# Patient Record
Sex: Male | Born: 1965 | Race: Black or African American | Hispanic: No | Marital: Single | Smoking: Never smoker
Health system: Southern US, Community
[De-identification: ages and names within clinical notes are randomized; demographics above are authoritative.]

## PROBLEM LIST (undated history)

## (undated) DIAGNOSIS — B20 Human immunodeficiency virus [HIV] disease: Secondary | ICD-10-CM

## (undated) DIAGNOSIS — E7800 Pure hypercholesterolemia, unspecified: Secondary | ICD-10-CM

## (undated) HISTORY — PX: KNEE SURGERY: SHX244

---

## 2015-04-24 ENCOUNTER — Emergency Department (HOSPITAL_COMMUNITY)
Admission: EM | Admit: 2015-04-24 | Discharge: 2015-04-24 | Disposition: A | Discharge status: Home / Self Care | Payer: BLUE CROSS/BLUE SHIELD | Attending: Emergency Medicine | Admitting: Emergency Medicine

## 2015-04-24 ENCOUNTER — Encounter (HOSPITAL_COMMUNITY): Payer: Self-pay | Admitting: Emergency Medicine

## 2015-04-24 DIAGNOSIS — R197 Diarrhea, unspecified: Secondary | ICD-10-CM | POA: Insufficient documentation

## 2015-04-24 DIAGNOSIS — Z21 Asymptomatic human immunodeficiency virus [HIV] infection status: Secondary | ICD-10-CM | POA: Diagnosis not present

## 2015-04-24 DIAGNOSIS — R1084 Generalized abdominal pain: Secondary | ICD-10-CM | POA: Diagnosis present

## 2015-04-24 DIAGNOSIS — Z8639 Personal history of other endocrine, nutritional and metabolic disease: Secondary | ICD-10-CM | POA: Diagnosis not present

## 2015-04-24 HISTORY — DX: Human immunodeficiency virus (HIV) disease: B20

## 2015-04-24 HISTORY — DX: Pure hypercholesterolemia, unspecified: E78.00

## 2015-04-24 NOTE — ED Notes (Signed)
C/o abdominal cramping (somewhat supra pubic)  X 4 days with mucus in his stool, diarrhea.  VSS

## 2015-04-24 NOTE — Discharge Instructions (Signed)

## 2015-04-24 NOTE — ED Provider Notes (Signed)
CSN: [id number]     Arrival date & time 04/24/15  0847 History   First MD Initiated Contact with Patient 04/24/15 [phone]     Chief Complaint  Patient presents with  . Abdominal Pain     (Consider location/radiation/quality/duration/timing/severity/associated sxs/prior Treatment) HPI Comments: Had diarrhea with some mucus in his stools. No fever or rectal bleeding.  Patient is a 49 y.o. male presenting with abdominal pain. The history is provided by the patient.  Abdominal Pain Pain location:  Generalized Pain quality: cramping   Pain radiates to:  Does not radiate Pain severity:  Mild Onset quality:  Gradual Duration:  5 days Timing:  Constant Progression:  Improving Chronicity:  New Context: not recent illness (no recent antibiotic use), not recent travel, not sick contacts and not suspicious food intake   Relieved by:  Nothing Worsened by:  Nothing tried Associated symptoms: diarrhea   Associated symptoms: no cough, no fever, no shortness of breath and no vomiting     Past Medical History  Diagnosis Date  . HIV (human immunodeficiency virus infection)   . Hypercholesteremia    Past Surgical History  Procedure Laterality Date  . Knee surgery Left    No family history on file. Social History  Substance Use Topics  . Smoking status: Never Smoker   . Smokeless tobacco: Never Used  . Alcohol Use: No    Review of Systems  Constitutional: Negative for fever.  Respiratory: Negative for cough and shortness of breath.   Gastrointestinal: Positive for abdominal pain (cramping) and diarrhea. Negative for vomiting.  All other systems reviewed and are negative.     Allergies  Review of patient's allergies indicates no known allergies.  Home Medications   Prior to Admission medications   Not on File   BP 138/87 mmHg  Pulse 71  Temp(Src) 98.2 F (36.8 C) (Oral)  Resp 18  SpO2 100% Physical Exam  Constitutional: He is oriented to person, place, and time. He  appears well-developed and well-nourished. No distress.  HENT:  Head: Normocephalic and atraumatic.  Mouth/Throat: No oropharyngeal exudate.  Eyes: EOM are normal. Pupils are equal, round, and reactive to light.  Neck: Normal range of motion. Neck supple.  Cardiovascular: Normal rate and regular rhythm.  Exam reveals no friction rub.   No murmur heard. Pulmonary/Chest: Effort normal and breath sounds normal. No respiratory distress. He has no wheezes. He has no rales.  Abdominal: He exhibits no distension. There is no tenderness. There is no rebound.  Musculoskeletal: Normal range of motion. He exhibits no edema.  Neurological: He is alert and oriented to person, place, and time.  Skin: He is not diaphoretic.  Nursing note and vitals reviewed.   ED Course  Procedures (including critical care time) Labs Review Labs Reviewed - No data to display  Imaging Review No results found. I have personally reviewed and evaluated these images and lab results as part of my medical decision-making.   EKG Interpretation None      MDM   Final diagnoses:  Diarrhea    49 year old male here with diarrhea. He came in because he was concerned about some mucus in his stools. Denies any fever, blood in his stools, abdominal pain. Had some abdominal cramping a few days ago but that has improved. No vomiting, nausea, chest pain, shortness of breath. He is compliant with his HIV medications but his arm or his previous CD4 count or viral load.. Vitals are stable and exam is benign. I explained we could  do labs and hydrate but he rather wait for his outpatient doctor to do this. He is has no pain at this time. No international travel or recent antibiotics. Feel he is stable for discharge.    Elwin Mocha, MD 04/24/15 [phone]

## 2015-04-24 NOTE — ED Notes (Signed)
Vital signs stable. No acute distress noted.  Will followup with provider on 17th of this month.  Understands to come back for signs of worsening in condition.

## 2015-04-24 NOTE — ED Notes (Signed)
Dr. Walden at bedside 

## 2015-10-29 ENCOUNTER — Emergency Department (HOSPITAL_COMMUNITY): Payer: BLUE CROSS/BLUE SHIELD

## 2015-10-29 ENCOUNTER — Emergency Department (HOSPITAL_COMMUNITY)
Admission: EM | Admit: 2015-10-29 | Discharge: 2015-10-29 | Disposition: A | Discharge status: Home / Self Care | Payer: BLUE CROSS/BLUE SHIELD | Attending: Emergency Medicine | Admitting: Emergency Medicine

## 2015-10-29 DIAGNOSIS — E7800 Pure hypercholesterolemia, unspecified: Secondary | ICD-10-CM | POA: Insufficient documentation

## 2015-10-29 DIAGNOSIS — R079 Chest pain, unspecified: Secondary | ICD-10-CM | POA: Insufficient documentation

## 2015-10-29 DIAGNOSIS — Z79899 Other long term (current) drug therapy: Secondary | ICD-10-CM | POA: Insufficient documentation

## 2015-10-29 DIAGNOSIS — B20 Human immunodeficiency virus [HIV] disease: Secondary | ICD-10-CM | POA: Insufficient documentation

## 2015-10-29 DIAGNOSIS — R42 Dizziness and giddiness: Secondary | ICD-10-CM | POA: Insufficient documentation

## 2015-10-29 LAB — BASIC METABOLIC PANEL
ANION GAP: 11 (ref 5–15)
BUN: 14 mg/dL (ref 6–20)
CALCIUM: 9.8 mg/dL (ref 8.9–10.3)
CHLORIDE: 105 mmol/L (ref 101–111)
CO2: 25 mmol/L (ref 22–32)
Creatinine, Ser: 1.2 mg/dL (ref 0.61–1.24)
GFR calc non Af Amer: >60 mL/min (ref >60)
GLUCOSE: 97 mg/dL (ref 65–99)
Potassium: 3.9 mmol/L (ref 3.5–5.1)
Sodium: 141 mmol/L (ref 135–145)

## 2015-10-29 LAB — I-STAT TROPONIN, ED
TROPONIN I, POC: 0 ng/mL (ref 0.00–0.08)
Troponin i, poc: 0 ng/mL (ref 0.00–0.08)

## 2015-10-29 LAB — CBC
HCT: 42.1 % (ref 39.0–52.0)
Hemoglobin: 13.5 g/dL (ref 13.0–17.0)
MCH: 28.1 pg (ref 26.0–34.0)
MCHC: 32.1 g/dL (ref 30.0–36.0)
MCV: 87.7 fL (ref 78.0–100.0)
PLATELETS: 194 10*3/uL (ref 150–400)
RBC: 4.8 MIL/uL (ref 4.22–5.81)
RDW: 14.2 % (ref 11.5–15.5)
WBC: 3.5 10*3/uL — ABNORMAL LOW (ref 4.0–10.5)

## 2015-10-29 LAB — D-DIMER, QUANTITATIVE: D-Dimer, Quant: <0.27 ug/mL-FEU (ref 0.00–0.50)

## 2015-10-29 NOTE — ED Notes (Addendum)
Pt sts that he started to have CP 1 week ago. Pt also has SOB and nausea when pain comes on. Pain is intermittent and is better when resting. No acute distress at this time.

## 2015-10-29 NOTE — ED Provider Notes (Signed)
CSN: [id number]     Arrival date & time 10/29/15  1041 History   First MD Initiated Contact with Patient 10/29/15 1649     Chief Complaint  Patient presents with  . Chest Pain     (Consider location/radiation/quality/duration/timing/severity/associated sxs/prior Treatment) HPI Comments: Patient with a history of HIV and hyperlipidemia presents with chest pain. He states he's had a one-week history of intermittent chest pain. He states he has it frequently during the day and when it comes and lasts about 5 minutes at a time. He describes as a sharp pain to the left side of his chest. He has some radiation down his left arm. There is no shortness of breath nausea or diaphoresis associated with it. The triage note states that he has shortness of breath and nausea however patient denies this to me. He does say that he gets lightheaded when the pain comes. He denies any palpitations. He denies any exertional symptoms. There is no pleuritic pain. It's not worse after eating. He denies any leg pain or swelling. There is no cough or chest congestion. He denies any past history of heart problems. He has a history of hyperlipidemia but no other risk factors for heart disease. There is no family history of heart disease. He's a nonsmoker. He does travel back and forth from [place] frequently. His PCP is in [place]. He sees her every 6 months. He denies any leg pain or swelling. He states the pain seems to be worse when he is more stressed at work. He states it's better at night when he is calm down and is resting.  Patient is a 50 y.o. male presenting with chest pain.  Chest Pain Associated symptoms: no abdominal pain, no back pain, no cough, no diaphoresis, no dizziness, no fatigue, no fever, no headache, no nausea, no numbness, no shortness of breath, not vomiting and no weakness     Past Medical History  Diagnosis Date  . HIV (human immunodeficiency virus infection)   . Hypercholesteremia    Past  Surgical History  Procedure Laterality Date  . Knee surgery Left    No family history on file. Social History  Substance Use Topics  . Smoking status: Never Smoker   . Smokeless tobacco: Never Used  . Alcohol Use: No    Review of Systems  Constitutional: Negative for fever, chills, diaphoresis and fatigue.  HENT: Negative for congestion, rhinorrhea and sneezing.   Eyes: Negative.   Respiratory: Negative for cough, chest tightness and shortness of breath.   Cardiovascular: Positive for chest pain. Negative for leg swelling.  Gastrointestinal: Negative for nausea, vomiting, abdominal pain, diarrhea and blood in stool.  Genitourinary: Negative for frequency, hematuria, flank pain and difficulty urinating.  Musculoskeletal: Negative for back pain and arthralgias.  Skin: Negative for rash.  Neurological: Positive for light-headedness. Negative for dizziness, speech difficulty, weakness, numbness and headaches.      Allergies  Review of patient's allergies indicates no known allergies.  Home Medications   Prior to Admission medications   Medication Sig Start Date End Date Taking? Authorizing Provider  efavirenz-emtricitabine-tenofovir (ATRIPLA) 600-200-300 MG tablet Take 1 tablet by mouth at bedtime.   Yes Historical Provider, MD  simvastatin (ZOCOR) 40 MG tablet Take 40 mg by mouth daily.   Yes Historical Provider, MD   BP 109/72 mmHg  Pulse 73  Temp(Src) 98.3 F (36.8 C) (Oral)  Resp 12  Ht  (1.956 m)  Wt 190 lb (86.183 kg)  BMI 22.53 kg/m2  SpO2 100% Physical Exam  Constitutional: He is oriented to person, place, and time. He appears well-developed and well-nourished.  HENT:  Head: Normocephalic and atraumatic.  Eyes: Pupils are equal, round, and reactive to light.  Neck: Normal range of motion. Neck supple.  Cardiovascular: Normal rate, regular rhythm and normal heart sounds.   Pulmonary/Chest: Effort normal and breath sounds normal. No respiratory distress. He  has no wheezes. He has no rales. He exhibits no tenderness.  Abdominal: Soft. Bowel sounds are normal. There is no tenderness. There is no rebound and no guarding.  Musculoskeletal: Normal range of motion. He exhibits no edema.  Lymphadenopathy:    He has no cervical adenopathy.  Neurological: He is alert and oriented to person, place, and time.  Skin: Skin is warm and dry. No rash noted.  Psychiatric: He has a normal mood and affect.    ED Course  Procedures (including critical care time) Labs Review Results for orders placed or performed during the hospital encounter of 10/29/15  Basic metabolic panel  Result Value Ref Range   Sodium 141 135 - 145 mmol/L   Potassium 3.9 3.5 - 5.1 mmol/L   Chloride 105 101 - 111 mmol/L   CO2 25 22 - 32 mmol/L   Glucose, Bld 97 65 - 99 mg/dL   BUN 14 6 - 20 mg/dL   Creatinine, Ser 1.611.20 0.61 - 1.24 mg/dL   Calcium 9.8 8.9 - 09.610.3 mg/dL   GFR calc non Af Amer >60 >60 mL/min   GFR calc Af Amer >60 >60 mL/min   Anion gap 11 5 - 15  CBC  Result Value Ref Range   WBC 3.5 (L) 4.0 - 10.5 K/uL   RBC 4.80 4.22 - 5.81 MIL/uL   Hemoglobin 13.5 13.0 - 17.0 g/dL   HCT 04.542.1 40.939.0 - 81.152.0 %   MCV 87.7 78.0 - 100.0 fL   MCH 28.1 26.0 - 34.0 pg   MCHC 32.1 30.0 - 36.0 g/dL   RDW 91.414.2 78.211.5 - 95.615.5 %   Platelets 194 150 - 400 K/uL  D-dimer, quantitative  Result Value Ref Range   D-Dimer, Quant <0.27 0.00 - 0.50 ug/mL-FEU  I-Stat Troponin, ED (not at [facility])  Result Value Ref Range   Troponin i, poc 0.00 0.00 - 0.08 ng/mL   Comment 3          I-stat troponin, ED  Result Value Ref Range   Troponin i, poc 0.00 0.00 - 0.08 ng/mL   Comment 3           Dg Chest 2 View  10/29/2015  CLINICAL DATA:  Left-sided chest pain for 1 week EXAM: CHEST  2 VIEW COMPARISON:  None. FINDINGS: The heart size and mediastinal contours are within normal limits. Both lungs are clear. The visualized skeletal structures are unremarkable. IMPRESSION: No active cardiopulmonary disease.  Electronically Signed   By: Alcide CleverMark  Lukens M.D.   On: 10/29/2015 11:34      Imaging Review Dg Chest 2 View  10/29/2015  CLINICAL DATA:  Left-sided chest pain for 1 week EXAM: CHEST  2 VIEW COMPARISON:  None. FINDINGS: The heart size and mediastinal contours are within normal limits. Both lungs are clear. The visualized skeletal structures are unremarkable. IMPRESSION: No active cardiopulmonary disease. Electronically Signed   By: Alcide CleverMark  Lukens M.D.   On: 10/29/2015 11:34   I have personally reviewed and evaluated these images and lab results as part of my medical decision-making.   EKG Interpretation  Date/Time:  Tuesday October 29 2015 10:48:59 EDT Ventricular Rate:  97 PR Interval:  142 QRS Duration: 86 QT Interval:  346 QTC Calculation: 439 R Axis:   81 Text Interpretation:  Normal sinus rhythm Normal ECG No old tracing to  compare Confirmed by Jinnifer Montejano  MD, Elvenia Godden ([id number]) on 10/29/2015 5:09:32 PM      MDM   Final diagnoses:  Chest pain, unspecified chest pain type    Patient presents with chest pain. He is currently pain-free. He has no exertional symptoms. No other suggestions of pulmonary embolus. His d-dimer is negative. His EKG does not show any ischemic changes. He's had delta troponin which have been negative. He has a heart score of 2. I feel he can be further evaluated as an outpatient. He has an appointment with his PCP in [place] on Friday which is 3 days from now. He was given strict return precautions.    Rolan Bucco, MD 10/29/15 [phone]

## 2015-10-29 NOTE — Discharge Instructions (Signed)
Nonspecific Chest Pain  °Chest pain can be caused by many different conditions. There is always a chance that your pain could be related to something serious, such as a heart attack or a blood clot in your lungs. Chest pain can also be caused by conditions that are not life-threatening. If you have chest pain, it is very important to follow up with your health care provider. °CAUSES  °Chest pain can be caused by: °· Heartburn. °· Pneumonia or bronchitis. °· Anxiety or stress. °· Inflammation around your heart (pericarditis) or lung (pleuritis or pleurisy). °· A blood clot in your lung. °· A collapsed lung (pneumothorax). It can develop suddenly on its own (spontaneous pneumothorax) or from trauma to the chest. °· Shingles infection (varicella-zoster virus). °· Heart attack. °· Damage to the bones, muscles, and cartilage that make up your chest wall. This can include: °¨ Bruised bones due to injury. °¨ Strained muscles or cartilage due to frequent or repeated coughing or overwork. °¨ Fracture to one or more ribs. °¨ Sore cartilage due to inflammation (costochondritis). °RISK FACTORS  °Risk factors for chest pain may include: °· Activities that increase your risk for trauma or injury to your chest. °· Respiratory infections or conditions that cause frequent coughing. °· Medical conditions or overeating that can cause heartburn. °· Heart disease or family history of heart disease. °· Conditions or health behaviors that increase your risk of developing a blood clot. °· Having had chicken pox (varicella zoster). °SIGNS AND SYMPTOMS °Chest pain can feel like: °· Burning or tingling on the surface of your chest or deep in your chest. °· Crushing, pressure, aching, or squeezing pain. °· Dull or sharp pain that is worse when you move, cough, or take a deep breath. °· Pain that is also felt in your back, neck, shoulder, or arm, or pain that spreads to any of these areas. °Your chest pain may come and go, or it may stay  constant. °DIAGNOSIS °Lab tests or other studies may be needed to find the cause of your pain. Your health care provider may have you take a test called an ambulatory ECG (electrocardiogram). An ECG records your heartbeat patterns at the time the test is performed. You may also have other tests, such as: °· Transthoracic echocardiogram (TTE). During echocardiography, sound waves are used to create a picture of all of the heart structures and to look at how blood flows through your heart. °· Transesophageal echocardiogram (TEE). This is a more advanced imaging test that obtains images from inside your body. It allows your health care provider to see your heart in finer detail. °· Cardiac monitoring. This allows your health care provider to monitor your heart rate and rhythm in real time. °· Holter monitor. This is a portable device that records your heartbeat and can help to diagnose abnormal heartbeats. It allows your health care provider to track your heart activity for several days, if needed. °· Stress tests. These can be done through exercise or by taking medicine that makes your heart beat more quickly. °· Blood tests. °· Imaging tests. °TREATMENT  °Your treatment depends on what is causing your chest pain. Treatment may include: °· Medicines. These may include: °¨ Acid blockers for heartburn. °¨ Anti-inflammatory medicine. °¨ Pain medicine for inflammatory conditions. °¨ Antibiotic medicine, if an infection is present. °¨ Medicines to dissolve blood clots. °¨ Medicines to treat coronary artery disease. °· Supportive care for conditions that do not require medicines. This may include: °¨ Resting. °¨ Applying heat   or cold packs to injured areas. °¨ Limiting activities until pain decreases. °HOME CARE INSTRUCTIONS °· If you were prescribed an antibiotic medicine, finish it all even if you start to feel better. °· Avoid any activities that bring on chest pain. °· Do not use any tobacco products, including  cigarettes, chewing tobacco, or electronic cigarettes. If you need help quitting, ask your health care provider. °· Do not drink alcohol. °· Take medicines only as directed by your health care provider. °· Keep all follow-up visits as directed by your health care provider. This is important. This includes any further testing if your chest pain does not go away. °· If heartburn is the cause for your chest pain, you may be told to keep your head raised (elevated) while sleeping. This reduces the chance that acid will go from your stomach into your esophagus. °· Make lifestyle changes as directed by your health care provider. These may include: °¨ Getting regular exercise. Ask your health care provider to suggest some activities that are safe for you. °¨ Eating a heart-healthy diet. A registered dietitian can help you to learn healthy eating options. °¨ Maintaining a healthy weight. °¨ Managing diabetes, if necessary. °¨ Reducing stress. °SEEK MEDICAL CARE IF: °· Your chest pain does not go away after treatment. °· You have a rash with blisters on your chest. °· You have a fever. °SEEK IMMEDIATE MEDICAL CARE IF:  °· Your chest pain is worse. °· You have an increasing cough, or you cough up blood. °· You have severe abdominal pain. °· You have severe weakness. °· You faint. °· You have chills. °· You have sudden, unexplained chest discomfort. °· You have sudden, unexplained discomfort in your arms, back, neck, or jaw. °· You have shortness of breath at any time. °· You suddenly start to sweat, or your skin gets clammy. °· You feel nauseous or you vomit. °· You suddenly feel light-headed or dizzy. °· Your heart begins to beat quickly, or it feels like it is skipping beats. °These symptoms may represent a serious problem that is an emergency. Do not wait to see if the symptoms will go away. Get medical help right away. Call your local emergency services (911 in the [place] Do not drive yourself to the hospital. °  °This  information is not intended to replace advice given to you by your health care provider. Make sure you discuss any questions you have with your health care provider. °  °Document Released: 05/13/2005 Document Revised: 08/24/2014 Document Reviewed: 03/09/2014 °Elsevier Interactive Patient Education ©2016 [employer] ° °

## 2015-11-07 ENCOUNTER — Emergency Department (HOSPITAL_COMMUNITY): Payer: BLUE CROSS/BLUE SHIELD

## 2015-11-07 ENCOUNTER — Emergency Department (HOSPITAL_COMMUNITY)
Admission: EM | Admit: 2015-11-07 | Discharge: 2015-11-07 | Disposition: A | Discharge status: Home / Self Care | Payer: BLUE CROSS/BLUE SHIELD | Attending: Emergency Medicine | Admitting: Emergency Medicine

## 2015-11-07 ENCOUNTER — Encounter (HOSPITAL_COMMUNITY): Payer: Self-pay | Admitting: Emergency Medicine

## 2015-11-07 DIAGNOSIS — S3991XA Unspecified injury of abdomen, initial encounter: Secondary | ICD-10-CM | POA: Insufficient documentation

## 2015-11-07 DIAGNOSIS — R55 Syncope and collapse: Secondary | ICD-10-CM

## 2015-11-07 DIAGNOSIS — Y9389 Activity, other specified: Secondary | ICD-10-CM | POA: Diagnosis not present

## 2015-11-07 DIAGNOSIS — Y9289 Other specified places as the place of occurrence of the external cause: Secondary | ICD-10-CM | POA: Diagnosis not present

## 2015-11-07 DIAGNOSIS — E7800 Pure hypercholesterolemia, unspecified: Secondary | ICD-10-CM | POA: Insufficient documentation

## 2015-11-07 DIAGNOSIS — Z79899 Other long term (current) drug therapy: Secondary | ICD-10-CM | POA: Diagnosis not present

## 2015-11-07 DIAGNOSIS — W01198A Fall on same level from slipping, tripping and stumbling with subsequent striking against other object, initial encounter: Secondary | ICD-10-CM | POA: Insufficient documentation

## 2015-11-07 DIAGNOSIS — Y998 Other external cause status: Secondary | ICD-10-CM | POA: Insufficient documentation

## 2015-11-07 DIAGNOSIS — S29001A Unspecified injury of muscle and tendon of front wall of thorax, initial encounter: Secondary | ICD-10-CM | POA: Insufficient documentation

## 2015-11-07 DIAGNOSIS — B20 Human immunodeficiency virus [HIV] disease: Secondary | ICD-10-CM | POA: Insufficient documentation

## 2015-11-07 LAB — BASIC METABOLIC PANEL
Anion gap: 8 (ref 5–15)
BUN: 11 mg/dL (ref 6–20)
CO2: 25 mmol/L (ref 22–32)
Calcium: 9.3 mg/dL (ref 8.9–10.3)
Chloride: 109 mmol/L (ref 101–111)
Creatinine, Ser: 1.24 mg/dL (ref 0.61–1.24)
GFR calc non Af Amer: >60 mL/min (ref >60)
Glucose, Bld: 93 mg/dL (ref 65–99)
Potassium: 4.2 mmol/L (ref 3.5–5.1)
Sodium: 142 mmol/L (ref 135–145)

## 2015-11-07 LAB — CBC
HEMATOCRIT: 38.9 % — AB (ref 39.0–52.0)
HEMOGLOBIN: 13.2 g/dL (ref 13.0–17.0)
MCH: 29.3 pg (ref 26.0–34.0)
MCHC: 33.9 g/dL (ref 30.0–36.0)
MCV: 86.4 fL (ref 78.0–100.0)
Platelets: 214 10*3/uL (ref 150–400)
RBC: 4.5 MIL/uL (ref 4.22–5.81)
RDW: 14.5 % (ref 11.5–15.5)
WBC: 5.6 10*3/uL (ref 4.0–10.5)

## 2015-11-07 LAB — I-STAT TROPONIN, ED: Troponin i, poc: 0 ng/mL (ref 0.00–0.08)

## 2015-11-07 MED ORDER — IBUPROFEN 400 MG PO TABS: 400.0000 mg | ORAL_TABLET | Freq: Four times a day (QID) | ORAL | Status: AC | PRN

## 2015-11-07 MED ORDER — METHOCARBAMOL 500 MG PO TABS: 500.0000 mg | ORAL_TABLET | Freq: Two times a day (BID) | ORAL | Status: AC

## 2015-11-07 MED ORDER — KETOROLAC TROMETHAMINE 60 MG/2ML IM SOLN
60.0000 mg | Freq: Once | INTRAMUSCULAR | Status: AC
  Administered 2015-11-07: 60 mg via INTRAMUSCULAR
  Filled 2015-11-07: qty 2

## 2015-11-07 MED ORDER — METHOCARBAMOL 500 MG PO TABS
1000.0000 mg | ORAL_TABLET | Freq: Once | ORAL | Status: AC
  Administered 2015-11-07: 1000 mg via ORAL
  Filled 2015-11-07: qty 2

## 2015-11-07 NOTE — ED Provider Notes (Signed)
The patient is a 50 year old male, he presents after having a dizzy spell where he lost his balance and fell striking the left lower back and side against a metal trash can as he fell to the ground. He denies having any trouble with his legs, no numbness or weakness, he did not hit his head and he denies having a syncopal episode. He now states that he remembers the entire course of events. There is been no chest pain, palpitations but states he has had some general lightheadedness. On my exam the patient has clear heart and lung sounds, normal finger-nose-finger, normal coordination, normal speech, cranial nerves III through XII are normal, abdomen soft and nontender, he does have mild tenderness over the left lower lateral and posterior ribs. There is no crepitance, no subcutaneous emphysema and no bruising. Imaging negative for fracture, the patient appears stable for discharge   EKG Interpretation  Date/Time:  Thursday November 07 2015 14:01:38 EDT Ventricular Rate:  84 PR Interval:  160 QRS Duration: 91 QT Interval:  381 QTC Calculation: 450 R Axis:   66 Text Interpretation:  Sinus rhythm Left atrial enlargement ST elev, probable normal early repol pattern Baseline wander in lead(s) II III aVR aVF V5 since last tracing no significant change - T wave inversion moer prominent in V3 Confirmed by Terrianna Holsclaw  MD, Kevonte Vanecek ([id number]) on 11/07/2015 2:13:52 PM       Medical screening examination/treatment/procedure(s) were conducted as a shared visit with non-physician practitioner(s) and myself.  I personally evaluated the patient during the encounter.  Clinical Impression:   Final diagnoses:  Near syncope         Eber HongBrian Ravon Mcilhenny, MD 11/09/15 [phone]

## 2015-11-07 NOTE — ED Provider Notes (Signed)
CSN: [id number]     Arrival date & time 11/07/15  1354 History   First MD Initiated Contact with Patient 11/07/15 1407     Chief Complaint  Patient presents with  . Near Syncope  . Fall   HPI  Thomas Lee is a 50 y.o. male PMH significant for HIV, hypercholesteremia presenting s/p a near syncopal episode at work today. He states he had been standing for "a while" when he fell onto his left flank onto a trash can. He denies syncope but states he can't remember the fall. He states this has happened previously and he was evaluated for similar on 3/14, but his presenting symptom then was chest pain, and he denies CP currently. He denies fevers, chills, SOB, abdominal pain, N/V, loss of bladder/bowel control.    Past Medical History  Diagnosis Date  . HIV (human immunodeficiency virus infection) (HCC)   . Hypercholesteremia    Past Surgical History  Procedure Laterality Date  . Knee surgery Left    History reviewed. No pertinent family history. Social History  Substance Use Topics  . Smoking status: Never Smoker   . Smokeless tobacco: Never Used  . Alcohol Use: No    Review of Systems  Ten systems are reviewed and are negative for acute change except as noted in the HPI  Allergies  Review of patient's allergies indicates no known allergies.  Home Medications   Prior to Admission medications   Medication Sig Start Date End Date Taking? Authorizing Provider  efavirenz-emtricitabine-tenofovir (ATRIPLA) 600-200-300 MG tablet Take 1 tablet by mouth at bedtime.    Historical Provider, MD  simvastatin (ZOCOR) 40 MG tablet Take 40 mg by mouth daily.    Historical Provider, MD   BP 129/96 mmHg  Pulse 83  Temp(Src) 98 F (36.7 C) (Oral)  Resp 17  Ht  (1.956 m)  Wt 88.451 kg  BMI 23.12 kg/m2  SpO2 99% Physical Exam  Constitutional: He is oriented to person, place, and time. He appears well-developed and well-nourished. No distress.  HENT:  Head: Normocephalic and  atraumatic.  Mouth/Throat: Oropharynx is clear and moist. No oropharyngeal exudate.  Eyes: Conjunctivae are normal. Pupils are equal, round, and reactive to light. Right eye exhibits no discharge. Left eye exhibits no discharge. No scleral icterus.  Neck: No tracheal deviation present.  Cardiovascular: Normal rate, regular rhythm, normal heart sounds and intact distal pulses.  Exam reveals no gallop and no friction rub.   No murmur heard. Pulmonary/Chest: Effort normal and breath sounds normal. No respiratory distress. He has no wheezes. He has no rales. He exhibits tenderness.  Left flank tenderness without crepitus, deformity, evidence of trauma.   Abdominal: Soft. Bowel sounds are normal. He exhibits no distension and no mass. There is no tenderness. There is no rebound and no guarding.  Musculoskeletal: Normal range of motion. He exhibits no edema or tenderness.  No midline cervical, thoracic, lumbar spinal tenderness.   Lymphadenopathy:    He has no cervical adenopathy.  Neurological: He is alert and oriented to person, place, and time. Coordination normal.  Cranial nerves 2-12 intact. Normal finger to nose, RAM, pronator drift.   Skin: Skin is warm and dry. No rash noted. He is not diaphoretic. No erythema.  Psychiatric: He has a normal mood and affect. His behavior is normal.  Nursing note and vitals reviewed.   ED Course  Procedures  Labs Review Labs Reviewed  CBC  BASIC METABOLIC PANEL  URINALYSIS, ROUTINE W REFLEX MICROSCOPIC (NOT  [facility])  Rosezena SensorI-STAT TROPOININ, ED   Imaging Review Dg Ribs Unilateral W/chest Left  11/07/2015  CLINICAL DATA:  Larey SeatFell.  Injured left ribs today. EXAM: LEFT RIBS AND CHEST - 3+ VIEW COMPARISON:  Chest x-ray 10/29/2015. FINDINGS: The cardiac silhouette, mediastinal and hilar contours are within normal limits and stable. The lungs are clear. No pleural effusion, pleural thickening or pneumothorax. Dedicated views of the left ribs did not demonstrate any  obvious left rib fractures. EKG leads were left on the patient which do limit the study slightly. IMPRESSION: No acute cardiopulmonary findings and no definite acute left-sided rib fractures. Electronically Signed   By: Rudie MeyerP.  Gallerani M.D.   On: 11/07/2015 15:08   I have personally reviewed and evaluated these images and lab results as part of my medical decision-making.   EKG Interpretation   Date/Time:  Thursday November 07 2015 14:01:38 EDT Ventricular Rate:  84 PR Interval:  160 QRS Duration: 91 QT Interval:  381 QTC Calculation: 450 R Axis:   66 Text Interpretation:  Sinus rhythm Left atrial enlargement ST elev,  probable normal early repol pattern Baseline wander in lead(s) II III aVR  aVF V5 since last tracing no significant change - T wave inversion moer  prominent in V3 Confirmed by MILLER  MD, BRIAN ([id number]) on 11/07/2015  2:13:52 PM      MDM   Final diagnoses:  Near syncope   Patient nontoxic appearing, VSS. Near syncope Based on patient history and physical exam, most likely etiologies include vasovagal vs psychogenic. Less likely etiologies include dysrhythmia, valvular heart disease, idiopathic hypertrophic subaortic stenosis/hypertrophic cardiomyopathy, cardiac tamponade, PE, TIA/stroke, ICH, GI bleed.  Troponin, CBC, BMP, EKG, left rib xrays and CXR unremarkable for acute change. Discussed case with Dr. Hyacinth MeekerMiller who advised discharge and close PCP follow-up.  Patient may be safely discharged home. Discussed reasons for return. Patient to follow-up with primary care provider within one week. Patient in understanding and agreement with the plan.   Melton KrebsSamantha Nicole Avnoor Koury, PA-C 11/08/15 [phone]  Eber HongBrian Miller, MD 11/09/15 [phone]

## 2015-11-07 NOTE — Discharge Instructions (Signed)
Mr. Thomas Lee,  Nice meeting you! Please follow-up with your primary care provider. Return to the emergency department if you develop headaches, nausea, vomiting, lose consciousness again, fall again. Feel better soon!  S. Lane HackerNicole Kaydynce Pat, PA-C  Near-Syncope Near-syncope (commonly known as near fainting) is sudden weakness, dizziness, or feeling like you might pass out. During an episode of near-syncope, you may also develop pale skin, have tunnel vision, or feel sick to your stomach (nauseous). Near-syncope may occur when getting up after sitting or while standing for a long time. It is caused by a sudden decrease in blood flow to the brain. This decrease can result from various causes or triggers, most of which are not serious. However, because near-syncope can sometimes be a sign of something serious, a medical evaluation is required. The specific cause is often not determined. HOME CARE INSTRUCTIONS  Monitor your condition for any changes. The following actions may help to alleviate any discomfort you are experiencing:  Have someone stay with you until you feel stable.  Lie down right away and prop your feet up if you start feeling like you might faint. Breathe deeply and steadily. Wait until all the symptoms have passed. Most of these episodes last only a few minutes. You may feel tired for several hours.   Drink enough fluids to keep your urine clear or pale yellow.   If you are taking blood pressure or heart medicine, get up slowly when seated or lying down. Take several minutes to sit and then stand. This can reduce dizziness.  Follow up with your health care provider as directed. SEEK IMMEDIATE MEDICAL CARE IF:   You have a severe headache.   You have unusual pain in the chest, abdomen, or back.   You are bleeding from the mouth or rectum, or you have black or tarry stool.   You have an irregular or very fast heartbeat.   You have repeated fainting or have seizure-like  jerking during an episode.   You faint when sitting or lying down.   You have confusion.   You have difficulty walking.   You have severe weakness.   You have vision problems.  MAKE SURE YOU:   Understand these instructions.  Will watch your condition.  Will get help right away if you are not doing well or get worse.   This information is not intended to replace advice given to you by your health care provider. Make sure you discuss any questions you have with your health care provider.   Document Released: 08/03/2005 Document Revised: 08/08/2013 Document Reviewed: 01/06/2013 Elsevier Interactive Patient Education [employer]2016 [employer] Inc.

## 2015-11-07 NOTE — ED Notes (Signed)
Pt comes from work with c/o feeling dizzy and weak. Pt fell into trashcan, hitting left lateral side.Pt A&OX4, NAD noted. Pt denies hitting head or LOC. Pt denies chest pain or SOB. VSS. Pt given a total of 50mcg Fentanyl and 30mg  Toradol for pain with a little relief.

## 2015-11-07 NOTE — ED Notes (Signed)
Patient transported to CT 

## 2017-09-14 IMAGING — DX DG CHEST 2V
2 series · 2 of 2 positions shown · non-contrast
Comparison: None.

CLINICAL DATA: Left-sided chest pain for 1 week

EXAM:
CHEST  2 VIEW

[w chest pa]
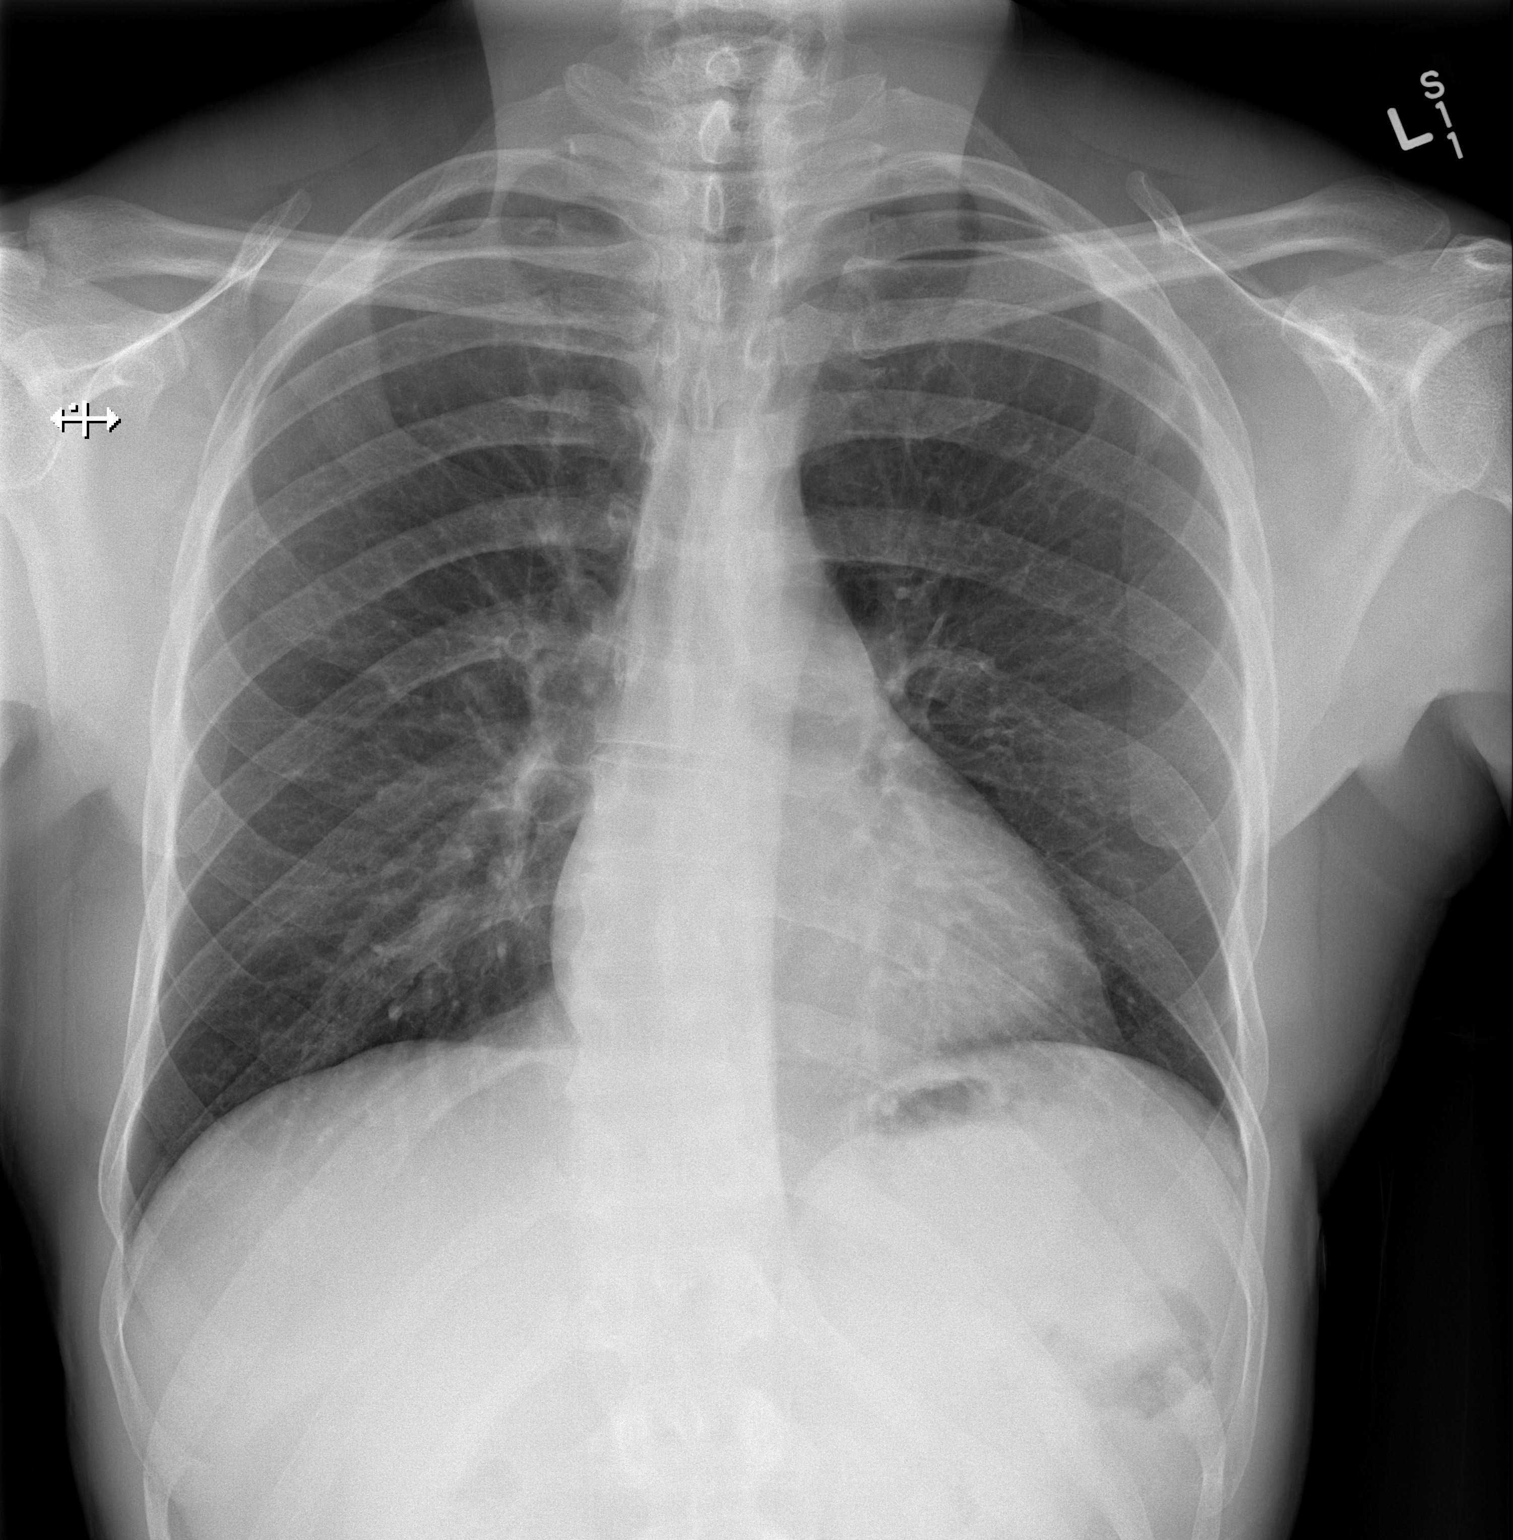

[w chest lat]
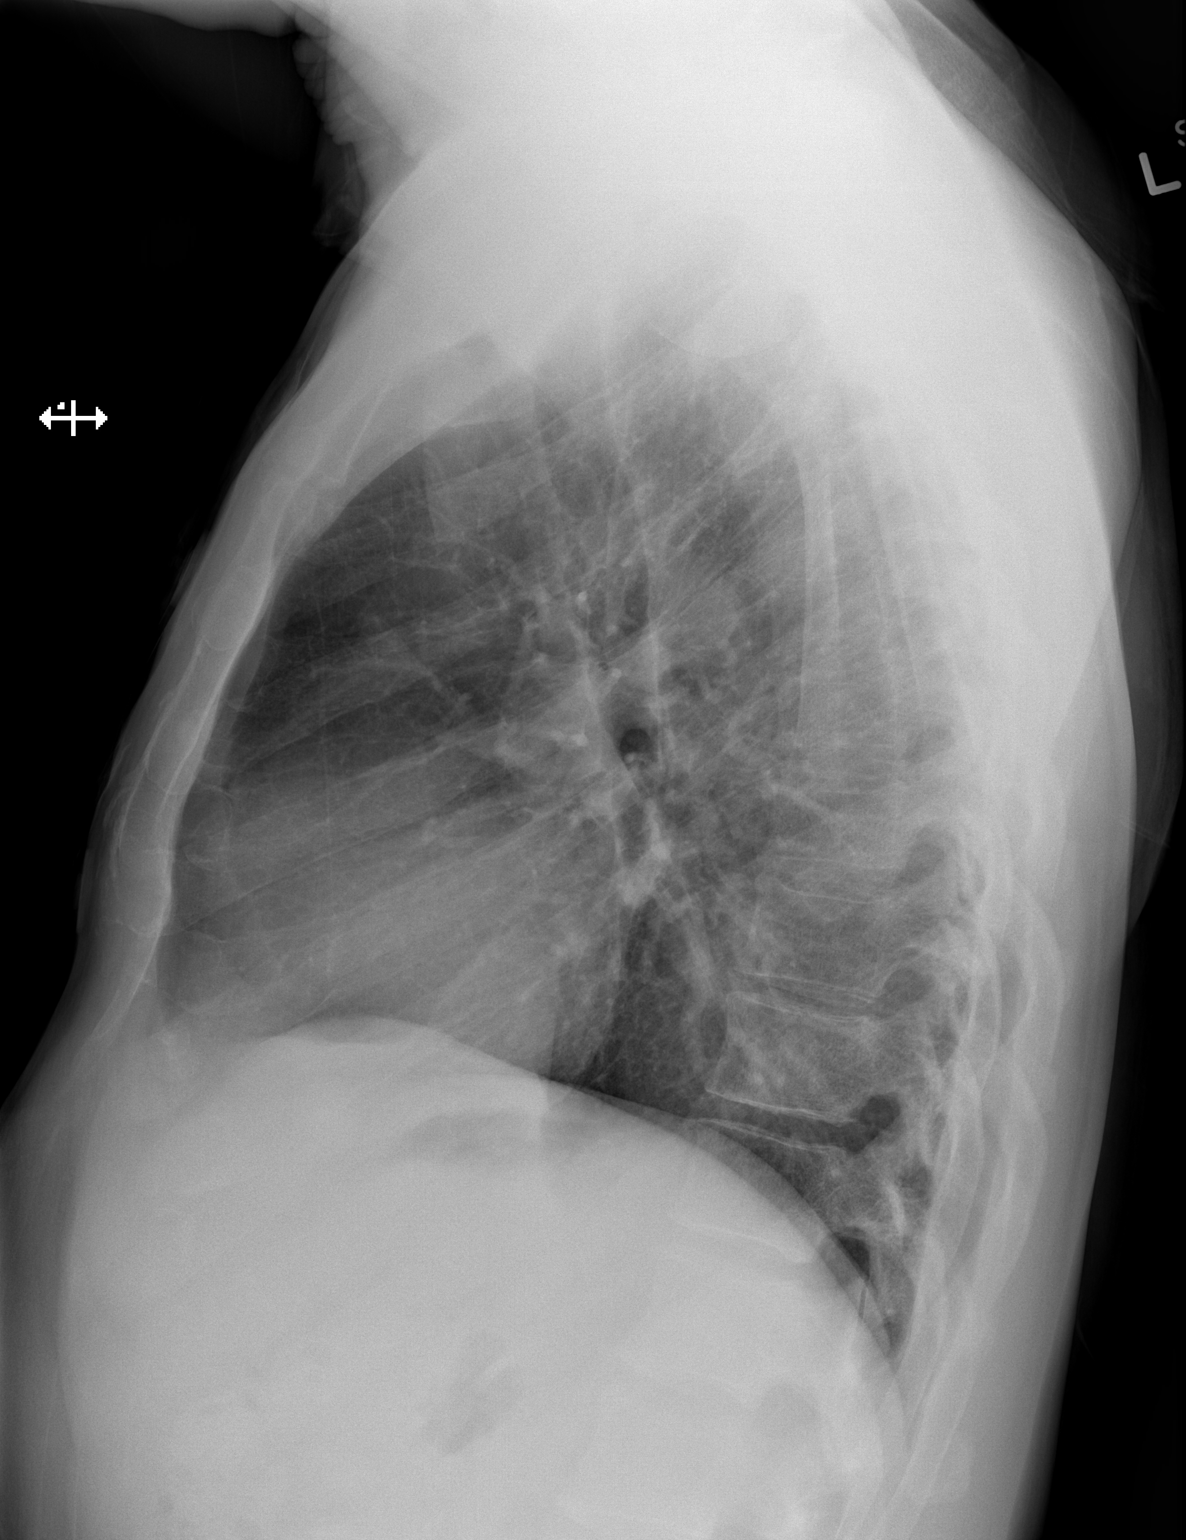

[2 of 2 positions shown; findings below may reference images not displayed]

FINDINGS: The heart size and mediastinal contours are within normal limits.
Both lungs are clear. The visualized skeletal structures are
unremarkable.
IMPRESSION: No active cardiopulmonary disease.

## 2017-09-23 IMAGING — DX DG RIBS W/ CHEST 3+V*L*
4 series · 4 of 4 positions shown · non-contrast
Comparison: Chest x-ray 10/29/2015.

CLINICAL DATA: Fell.  Injured left ribs today.

EXAM:
LEFT RIBS AND CHEST - 3+ VIEW

[w chest pa]
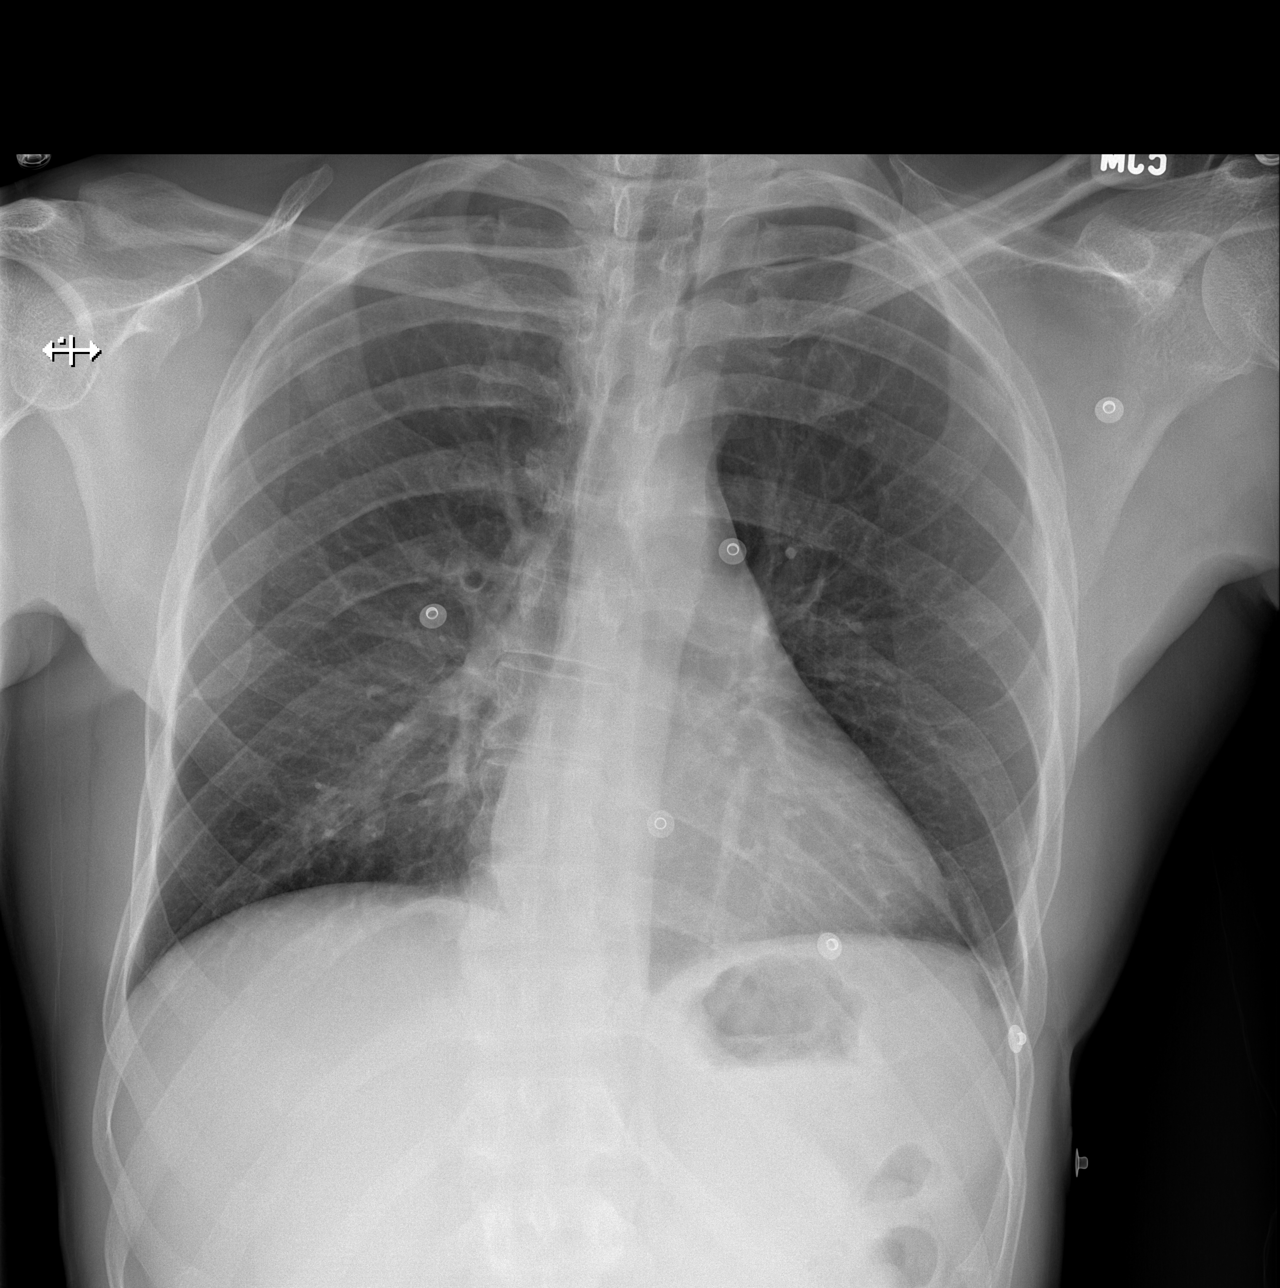

[w ribs ap upper left]
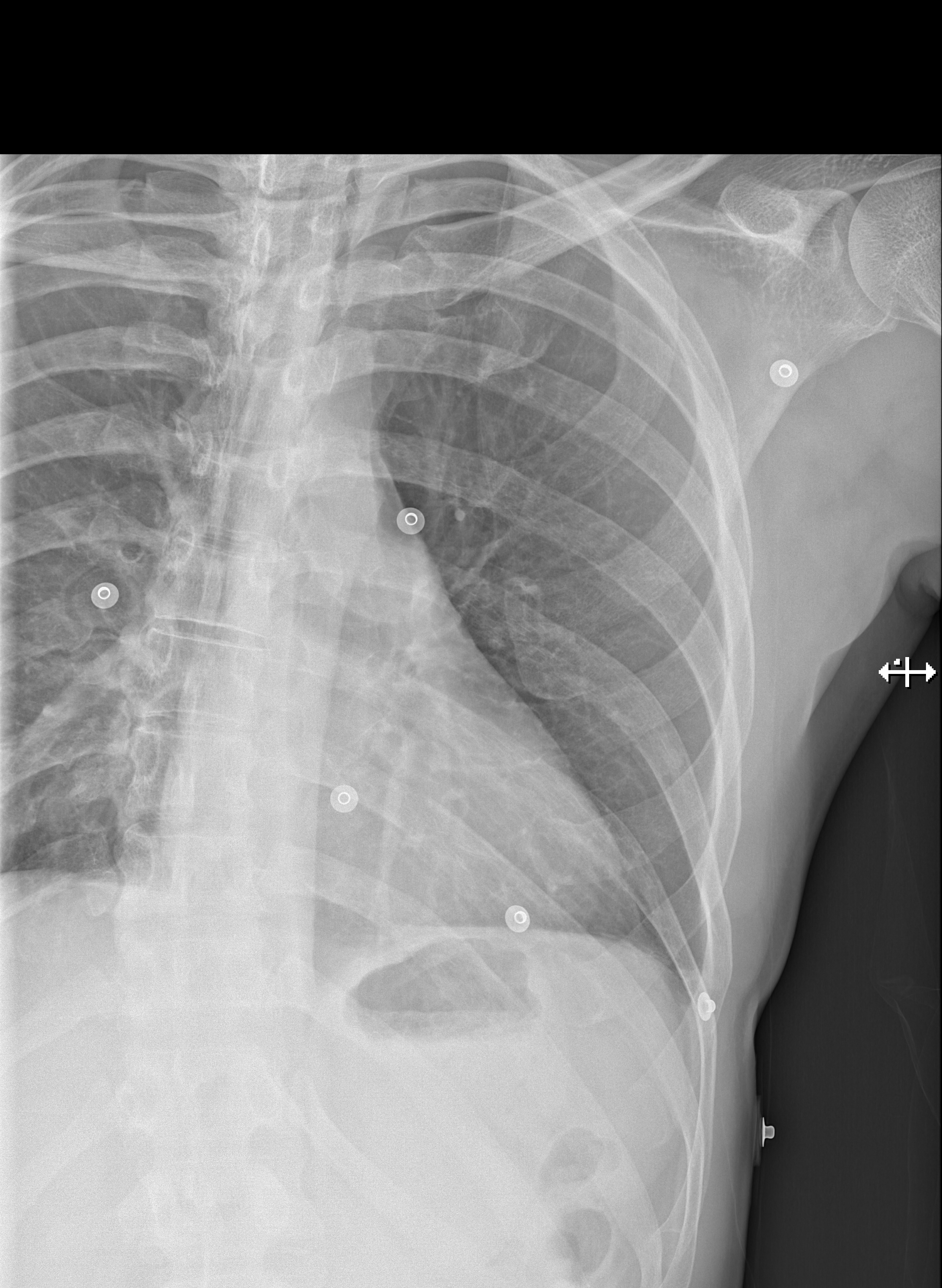

[w ribs ap lower left]
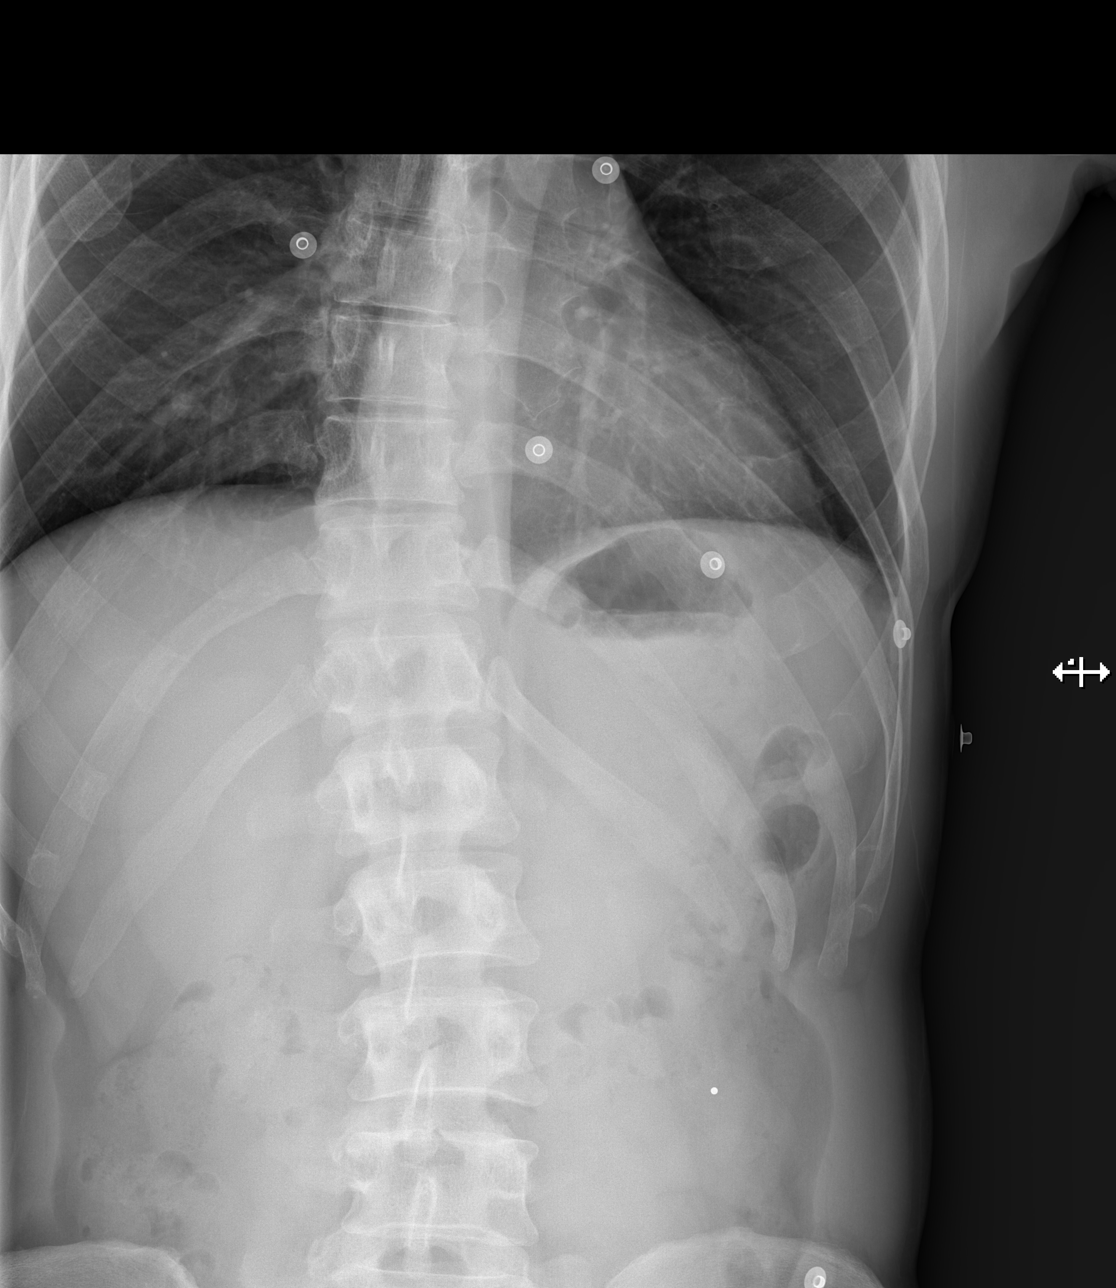

[w ribs obl left]
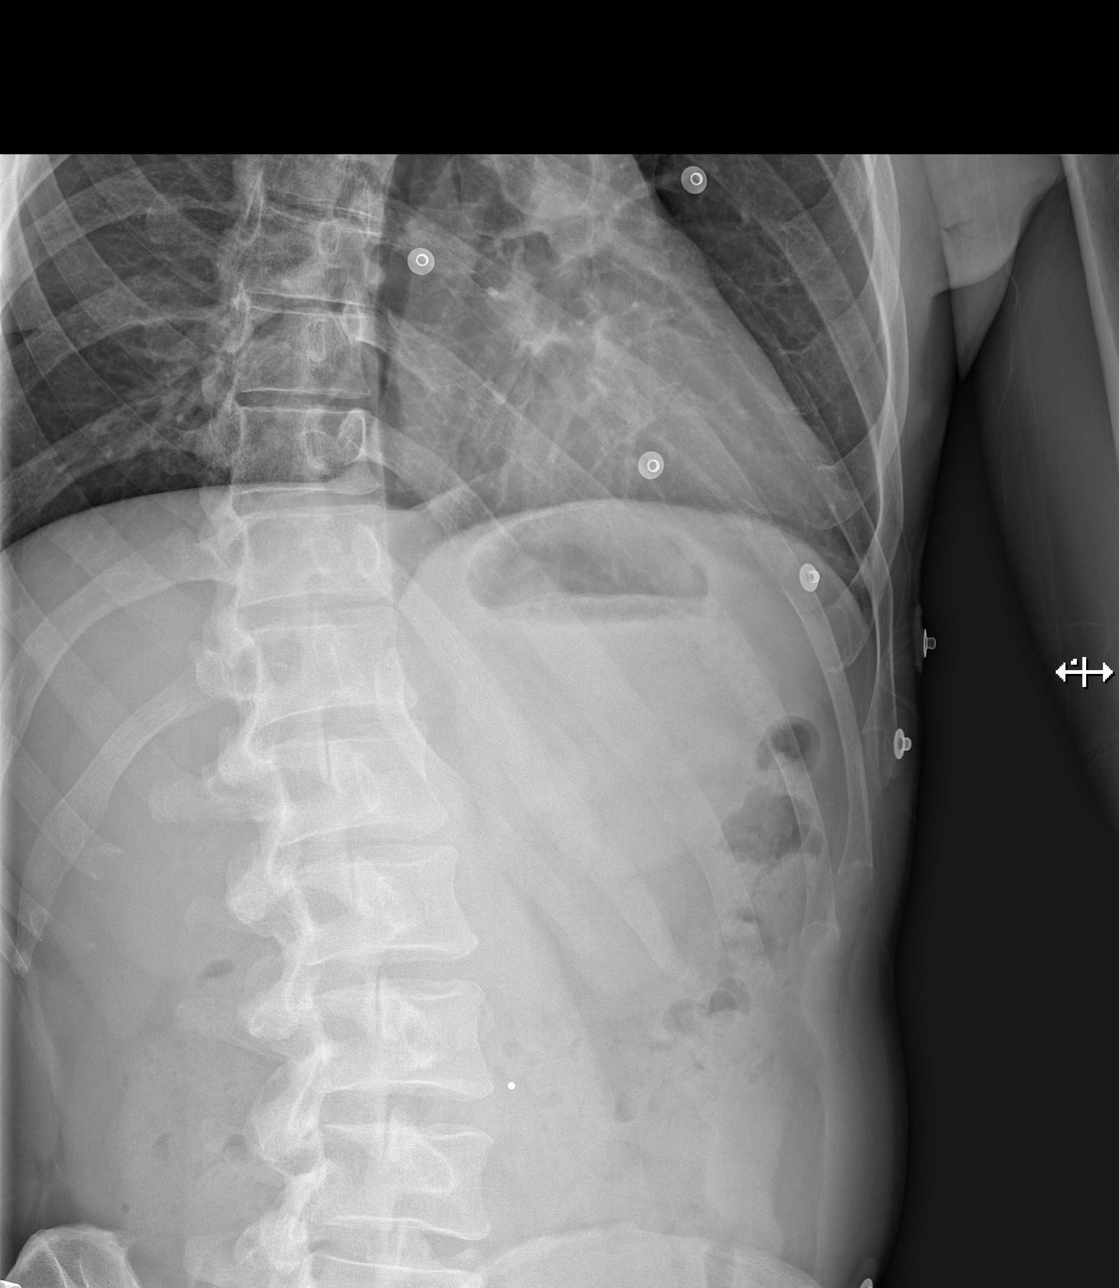

[4 of 4 positions shown; findings below may reference images not displayed]

FINDINGS: The cardiac silhouette, mediastinal and hilar contours are within
normal limits and stable. The lungs are clear. No pleural effusion,
pleural thickening or pneumothorax.

Dedicated views of the left ribs did not demonstrate any obvious
left rib fractures. EKG leads were left on the patient which do
limit the study slightly.
IMPRESSION: No acute cardiopulmonary findings and no definite acute left-sided
rib fractures.

## 2021-07-10 ENCOUNTER — Inpatient Hospital Stay
Admit: 2021-07-10 | Discharge: 2021-07-10 | Disposition: A | Discharge status: Home / Self Care | Payer: BLUE CROSS/BLUE SHIELD | Attending: Emergency Medicine

## 2021-07-10 DIAGNOSIS — T783XXA Angioneurotic edema, initial encounter: Secondary | ICD-10-CM

## 2021-07-10 MED ORDER — PREDNISONE 20 MG TAB
20 mg | ORAL | Status: AC
Start: 2021-07-10 — End: 2021-07-10
  Administered 2021-07-10: 13:00:00 via ORAL

## 2021-07-10 MED ORDER — PREDNISONE 20 MG TAB
20 mg | ORAL_TABLET | Freq: Every day | ORAL | 0 refills | Status: AC
Start: 2021-07-10 — End: 2021-07-15

## 2021-07-10 MED FILL — PREDNISONE 20 MG TAB: 20 mg | ORAL | Qty: 3

## 2021-07-10 NOTE — ED Notes (Signed)
Pt arrives with left sided lip swelling. States it was swollen when he woke up. Pt denies pain.

## 2021-07-10 NOTE — ED Provider Notes (Signed)
ED Provider Notes by Forde Radon, MD at 07/10/21 8315                Author: Forde Radon, MD  Service: --  Author Type: Physician       Filed: 07/10/21 0804  Date of Service: 07/10/21 0802  Status: Signed          Editor: Forde Radon, MD (Physician)                                EMERGENCY DEPARTMENT HISTORY AND PHYSICAL EXAM         Date: 07/10/2021   Patient Name: Scott House        History of Presenting Illness          Chief Complaint       Patient presents with        ?  Facial Pain           History Provided By: Patient      HPI: Scott House, 55 y.o. male with a past medical history significant  HIV  presents to the ED with chief complaint of Facial Pain   .    76-year-old male presents with no new medications.  No family history of swelling.  Had a brief episode a couple years ago with swelling that resolved on its own without intervention.  Woke up this morning to slight upper lip swelling on the left side.   No tongue swelling.  No difficulty swallowing no lower lip swelling.  No new foods or medications.  Not on lisinopril or ACE inhibitor.  No family history of the same.  No meds prior to arrival.  Still slightly present.  No or dental redness swelling.            There are no other complaints, changes, or physical findings at this time.      PCP: Other, Phys, MD        Current Outpatient Medications          Medication  Sig  Dispense  Refill           ?  predniSONE (DELTASONE) 20 mg tablet  Take 3 Tablets by mouth daily for 5 days. With Breakfast  15 Tablet  0             Past History        Past Medical History:     Past Medical History:        Diagnosis  Date         ?  HIV (human immunodeficiency virus infection) (HCC)           ?  Hyperlipidemia             Past Surgical History:   No past surgical history on file.      Family History:   History reviewed. No pertinent family history.      Social History: denies       Allergies:   No Known Allergies           Review of  Systems     Review of Systems    Constitutional: Negative.  Negative for chills, fatigue and fever.    HENT:  Positive for facial swelling. Negative for congestion, ear pain, nosebleeds and sore throat.     Eyes: Negative.  Negative for pain, discharge and visual disturbance.    Respiratory: Negative.  Negative for cough, chest tightness and shortness of breath.     Cardiovascular: Negative.  Negative for chest pain and leg swelling.    Gastrointestinal: Negative.  Negative for abdominal pain, blood in stool, constipation, diarrhea, nausea and vomiting.    Endocrine: Negative.     Genitourinary: Negative.  Negative for difficulty urinating, dysuria and flank pain.    Musculoskeletal: Negative.  Negative for back pain and myalgias.    Skin: Negative.  Negative for rash and wound.    Allergic/Immunologic: Negative.     Neurological: Negative.  Negative for dizziness, syncope, weakness, numbness and headaches.    Hematological: Negative.  Does not bruise/bleed easily.    Psychiatric/Behavioral: Negative.  Negative for agitation, confusion, hallucinations and  suicidal ideas.     All other systems reviewed and are negative.        Physical Exam     Physical Exam   Vitals and nursing note reviewed.    Constitutional:        General: He is not in acute distress.      Appearance: He is normal weight. He is not ill-appearing.    HENT:       Head: Normocephalic and atraumatic.       Comments: Slight left upper lip swelling.  Tolerating secretions well.  No tongue swelling.  Able to protrude normally.  No abnormal dentition.  No gum tenderness.  No rash or redness.      Right Ear: External ear normal.       Left Ear: External ear normal.       Nose: Nose normal. No rhinorrhea.       Mouth/Throat:       Mouth: Mucous membranes are moist.       Pharynx: Oropharynx is clear.    Eyes:       Extraocular Movements: Extraocular movements intact.       Conjunctiva/sclera: Conjunctivae normal.       Pupils: Pupils are equal, round,  and reactive to light.     Cardiovascular:       Rate and Rhythm: Normal rate and regular rhythm.       Pulses: Normal pulses.       Heart sounds: Normal heart sounds.    Pulmonary:       Effort: Pulmonary effort is normal. No respiratory distress.       Breath sounds: Normal breath sounds.     Abdominal:       General: Abdomen is flat. Bowel sounds are normal.       Palpations: Abdomen is soft.     Musculoskeletal:          General: No tenderness or deformity. Normal range of motion.       Cervical back: Normal range of motion and neck supple.    Skin:      General: Skin is warm and dry.       Capillary Refill: Capillary refill takes less than 2 seconds.       Findings: No bruising, lesion or rash.    Neurological:       General: No focal deficit present.       Mental Status: He is alert and oriented to person, place, and time. Mental status is at baseline.    Psychiatric:          Mood and Affect: Mood normal.          Behavior: Behavior normal.  Thought Content: Thought content normal.          Judgment: Judgment normal.            Diagnostic Study Results        Labs -    No results found for this or any previous visit (from the past 12 hour(s)).         Radiologic Studies -      No orders to display          CT Results  (Last 48 hours)             None                    CXR Results  (Last 48 hours)             None                       Medical Decision Making and ED Course     I am the first provider for this patient.      I reviewed the vital signs, available nursing notes, past medical history, past surgical history, family history and social history.      Vital Signs-Reviewed the patient's vital signs.   Patient Vitals for the past 12 hrs:            Temp  Pulse  Resp  BP  SpO2            07/10/21 0734  97.6 ??F (36.4 ??C)  80  18  (!) 152/99  99 %           EKG interpretation:             Records Reviewed: Previous Hospital chart. EMS run report         ED Course:    Initial assessment performed.  The patients presenting problems have been discussed, and they are in agreement with the care plan formulated and outlined with them.  I have encouraged them to ask questions as they arise throughout their visit.        Orders Placed This Encounter        ?  predniSONE (DELTASONE) tablet 60 mg     ?  predniSONE (DELTASONE) 20 mg tablet             Sig: Take 3 Tablets by mouth daily for 5 days. With Breakfast         Dispense:  15 Tablet             Refill:  0                          Provider Notes (Medical Decision Making):    55 year old male presents with slight lip swelling.  Similar episode in the past.  No interventions were needed.  Concern for angioedema.  Will discharge home with prednisone as there is no evidence  of airway compromise.  Advised Benadryl.  Patient will follow up closely with his PCP for further testing.  At this point no triggers of food, medications and no family history noted.  Patient aware of return precautions including swelling difficulty  swallowing.             Consults                     Discharged        Procedures  Disposition           Emergency Department Disposition:  Discharged         DISCHARGE PLAN:      Patient is discharged home.  Discharge instructions provided.  Patient is stable and improved at time of disposition.  Vitals are stable.      1.      Current Discharge Medication List                 START taking these medications          Details        predniSONE (DELTASONE) 20 mg tablet  Take 3 Tablets by mouth daily for 5 days. With Breakfast   Qty: 15 Tablet, Refills: 0                      2.      Follow-up Information                  Follow up With  Specialties  Details  Why  Contact Info              Blake Divine, MD  Internal Medicine Physician    or your pcp  [address] [place] [address]   [phone]                 Nicholes Stairs, MD  Otolaryngology    or your pcp  [address]   [address]   [place]  [place] [address]   [phone]                     3.  Return to ED if worse       Pt voiced they understand they plan and do not have questions at this time           Diagnosis        Clinical Impression:       1.  Angioedema, initial encounter            Attestations:      Forde Radon, MD      Please note that this dictation was completed with Dragon, the computer voice recognition software.  Quite often unanticipated grammatical, syntax, homophones, and other interpretive errors are inadvertently  transcribed by the computer software.  Please disregard these errors.  Please excuse any errors that have escaped final proofreading.  Thank you.

## 2021-07-10 NOTE — ED Notes (Signed)
Provider at bedside for dispo and follow up, all treatments completed as ordered. Discharge plan reviewed and paperwork signed, teaching completed regarding treatment received, medications and follow up care demonstrated and read back, pt walked out of the ER with steady gait and no signs of acute distress.
